# Patient Record
Sex: Female | Born: 1995 | Race: Black or African American | Hispanic: No | Marital: Single | Smoking: Current every day smoker
Health system: Southern US, Community
[De-identification: ages and names within clinical notes are randomized; demographics above are authoritative.]

## PROBLEM LIST (undated history)

## (undated) DIAGNOSIS — R569 Unspecified convulsions: Secondary | ICD-10-CM

## (undated) DIAGNOSIS — N390 Urinary tract infection, site not specified: Secondary | ICD-10-CM

---
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
## 2015-02-23 ENCOUNTER — Emergency Department (HOSPITAL_COMMUNITY)
Admission: EM | Admit: 2015-02-23 | Discharge: 2015-02-23 | Disposition: A | Discharge status: Home / Self Care | Payer: Medicaid Other | Attending: Emergency Medicine | Admitting: Emergency Medicine

## 2015-02-23 ENCOUNTER — Encounter (HOSPITAL_COMMUNITY): Payer: Self-pay

## 2015-02-23 DIAGNOSIS — S39012A Strain of muscle, fascia and tendon of lower back, initial encounter: Secondary | ICD-10-CM | POA: Insufficient documentation

## 2015-02-23 DIAGNOSIS — Y9289 Other specified places as the place of occurrence of the external cause: Secondary | ICD-10-CM | POA: Insufficient documentation

## 2015-02-23 DIAGNOSIS — F17210 Nicotine dependence, cigarettes, uncomplicated: Secondary | ICD-10-CM | POA: Insufficient documentation

## 2015-02-23 DIAGNOSIS — Y9389 Activity, other specified: Secondary | ICD-10-CM | POA: Insufficient documentation

## 2015-02-23 DIAGNOSIS — X58XXXA Exposure to other specified factors, initial encounter: Secondary | ICD-10-CM | POA: Insufficient documentation

## 2015-02-23 DIAGNOSIS — Z87440 Personal history of urinary (tract) infections: Secondary | ICD-10-CM | POA: Insufficient documentation

## 2015-02-23 DIAGNOSIS — Z3202 Encounter for pregnancy test, result negative: Secondary | ICD-10-CM | POA: Insufficient documentation

## 2015-02-23 DIAGNOSIS — Y998 Other external cause status: Secondary | ICD-10-CM | POA: Insufficient documentation

## 2015-02-23 LAB — I-STAT BETA HCG BLOOD, ED (MC, WL, AP ONLY)

## 2015-02-23 MED ORDER — METHOCARBAMOL 500 MG PO TABS: 500.0000 mg | ORAL_TABLET | Freq: Two times a day (BID) | ORAL | Status: DC

## 2015-02-23 MED ORDER — NAPROXEN 500 MG PO TABS: 500.0000 mg | ORAL_TABLET | Freq: Two times a day (BID) | ORAL | Status: DC
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
## 2015-02-23 NOTE — Discharge Instructions (Signed)
Recommend insoles in your shoes for support. Take breaks every few hours when standing for long periods of time. Alternate ice and heat to your back for formation and spasms. Take the medications prescribed to you as needed. Follow-up with your primary care doctor for a recheck of symptoms.  Back Injury Prevention Back injuries can be very painful. They can also be difficult to heal. After having one back injury, you are more likely to injure your back again. It is important to learn how to avoid injuring or re-injuring your back. The following tips can help you to prevent a back injury. WHAT SHOULD I KNOW ABOUT PHYSICAL FITNESS?  Exercise for 30 minutes per day on most days of the week or as told by your doctor. Make sure to:  Do aerobic exercises, such as walking, jogging, biking, or swimming.  Do exercises that increase balance and strength, such as tai chi and yoga.  Do stretching exercises. This helps with flexibility.  Try to develop strong belly (abdominal) muscles. Your belly muscles help to support your back.  Stay at a healthy weight. This helps to decrease your risk of a back injury. WHAT SHOULD I KNOW ABOUT MY DIET?  Talk with your doctor about your overall diet. Take supplements and vitamins only as told by your doctor.  Talk with your doctor about how much calcium and vitamin D you need each day. These nutrients help to prevent weakening of the bones (osteoporosis).  Include good sources of calcium in your diet, such as:  Dairy products.  Green leafy vegetables.  Products that have had calcium added to them (fortified).  Include good sources of vitamin D in your diet, such as:  Milk.  Foods that have had vitamin D added to them. WHAT SHOULD I KNOW ABOUT MY POSTURE?  Sit up straight and stand up straight. Avoid leaning forward when you sit or hunching over when you stand.  Choose chairs that have good low-back (lumbar) support.  If you work at a desk, sit close  to it so you do not need to lean over. Keep your chin tucked in. Keep your neck drawn back. Keep your elbows bent so your arms look like the letter "L" (right angle).  Sit high and close to the steering wheel when you drive. Add a low-back support to your car seat, if needed.  Avoid sitting or standing in one position for very long. Take breaks to get up, stretch, and walk around at least one time every hour. Take breaks every hour if you are driving for long periods of time.  Sleep on your side with your knees slightly bent, or sleep on your back with a pillow under your knees. Do not lie on the front of your body to sleep. WHAT SHOULD I KNOW ABOUT LIFTING, TWISTING, AND REACHING Lifting and Heavy Lifting  Avoid heavy lifting, especially lifting over and over again. If you must do heavy lifting:  Stretch before lifting.  Work slowly.  Rest between lifts.  Use a tool such as a cart or a dolly to move objects if one is available.  Make several small trips instead of carrying one heavy load.  Ask for help when you need it, especially when moving big objects.  Follow these steps when lifting:  Stand with your feet shoulder-width apart.  Get as close to the object as you can. Do not pick up a heavy object that is far from your body.  Use handles or lifting straps if  they are available.  Bend at your knees. Squat down, but keep your heels off the floor.  Keep your shoulders back. Keep your chin tucked in. Keep your back straight.  Lift the object slowly while you tighten the muscles in your legs, belly, and butt. Keep the object as close to the center of your body as possible.  Follow these steps when putting down a heavy load:  Stand with your feet shoulder-width apart.  Lower the object slowly while you tighten the muscles in your legs, belly, and butt. Keep the object as close to the center of your body as possible.  Keep your shoulders back. Keep your chin tucked in. Keep  your back straight.  Bend at your knees. Squat down, but keep your heels off the floor.  Use handles or lifting straps if they are available. Twisting and Reaching  Avoid lifting heavy objects above your waist.  Do not twist at your waist while you are lifting or carrying a load. If you need to turn, move your feet.  Do not bend over without bending at your knees.  Avoid reaching over your head, across a table, or for an object on a high surface.  WHAT ARE SOME OTHER TIPS?  Avoid wet floors and icy ground. Keep sidewalks clear of ice to prevent falls.   Do not sleep on a mattress that is too soft or too hard.   Keep items that you use often within easy reach.   Put heavier objects on shelves at waist level, and put lighter objects on lower or higher shelves.  Find ways to lower your stress, such as:  Exercise.  Massage.  Relaxation techniques.  Talk with your doctor if you feel anxious or depressed. These conditions can make back pain worse.  Wear flat heel shoes with cushioned soles.  Avoid making quick (sudden) movements.  Use both shoulder straps when carrying a backpack.  Do not use any tobacco products, including cigarettes, chewing tobacco, or electronic cigarettes. If you need help quitting, ask your doctor.   This information is not intended to replace advice given to you by your health care provider. Make sure you discuss any questions you have with your health care provider.   Document Released: 08/16/2007 Document Revised: 07/14/2014 Document Reviewed: 03/03/2014 Elsevier Interactive Patient Education 2016 Else[employer] Inc[employer] Lumbosacral Strain Lumbosacral strain is a strain of any of the parts that make up your lumbosacral vertebrae. Your lumbosacral vertebrae are the bones that make up the lower third of your backbone. Your lumbosacral vertebrae are held together by muscles and tough, fibrous tissue (ligaments).  CAUSES  A sudden blow to your back can  cause lumbosacral strain. Also, anything that causes an excessive stretch of the muscles in the low back can cause this strain. This is typically seen when people exert themselves strenuously, fall, lift heavy objects, bend, or crouch repeatedly. RISK FACTORS  Physically demanding work.  Participation in pushing or pulling sports or sports that require a sudden twist of the back (tennis, golf, baseball).  Weight lifting.  Excessive lower back curvature.  Forward-tilted pelvis.  Weak back or abdominal muscles or both.  Tight hamstrings. SIGNS AND SYMPTOMS  Lumbosacral strain may cause pain in the area of your injury or pain that moves (radiates) down your leg.  DIAGNOSIS Your health care provider can often diagnose lumbosacral strain through a physical exam. In some cases, you may need tests such as X-ray exams.  TREATMENT  Treatment for your lower back  injury depends on many factors that your clinician will have to evaluate. However, most treatment will include the use of anti-inflammatory medicines. HOME CARE INSTRUCTIONS   Avoid hard physical activities (tennis, racquetball, waterskiing) if you are not in proper physical condition for it. This may aggravate or create problems.  If you have a back problem, avoid sports requiring sudden body movements. Swimming and walking are generally safer activities.  Maintain good posture.  Maintain a healthy weight.  For acute conditions, you may put ice on the injured area.  Put ice in a plastic bag.  Place a towel between your skin and the bag.  Leave the ice on for 20 minutes, 2-3 times a day.  When the low back starts healing, stretching and strengthening exercises may be recommended. SEEK MEDICAL CARE IF:  Your back pain is getting worse.  You experience severe back pain not relieved with medicines. SEEK IMMEDIATE MEDICAL CARE IF:   You have numbness, tingling, weakness, or problems with the use of your arms or  legs.  There is a change in bowel or bladder control.  You have increasing pain in any area of the body, including your belly (abdomen).  You notice shortness of breath, dizziness, or feel faint.  You feel sick to your stomach (nauseous), are throwing up (vomiting), or become sweaty.  You notice discoloration of your toes or legs, or your feet get very cold. MAKE SURE YOU:   Understand these instructions.  Will watch your condition.  Will get help right away if you are not doing well or get worse.   This information is not intended to replace advice given to you by your health care provider. Make sure you discuss any questions you have with your health care provider.   Document Released: 12/07/2004 Document Revised: 03/20/2014 Document Reviewed: 10/16/2012 Elsevier Interactive Patient Education [employer].

## 2015-02-23 NOTE — ED Notes (Signed)
Nurse drawing labs. 

## 2015-02-23 NOTE — ED Notes (Signed)
Pt presents with c/o lower back pain. Pt reports she has had this for approx 1.5 months after she started working at a [employer]. No injury.

## 2015-10-28 ENCOUNTER — Emergency Department (HOSPITAL_COMMUNITY)
Admission: EM | Admit: 2015-10-28 | Discharge: 2015-10-29 | Disposition: A | Discharge status: Home / Self Care | Payer: No Typology Code available for payment source | Attending: Emergency Medicine | Admitting: Emergency Medicine

## 2015-10-28 ENCOUNTER — Encounter (HOSPITAL_COMMUNITY): Payer: Self-pay | Admitting: Emergency Medicine

## 2015-10-28 DIAGNOSIS — Y92410 Unspecified street and highway as the place of occurrence of the external cause: Secondary | ICD-10-CM | POA: Diagnosis not present

## 2015-10-28 DIAGNOSIS — M79601 Pain in right arm: Secondary | ICD-10-CM | POA: Diagnosis not present

## 2015-10-28 DIAGNOSIS — Y999 Unspecified external cause status: Secondary | ICD-10-CM | POA: Insufficient documentation

## 2015-10-28 DIAGNOSIS — S299XXA Unspecified injury of thorax, initial encounter: Secondary | ICD-10-CM | POA: Diagnosis present

## 2015-10-28 DIAGNOSIS — Y939 Activity, unspecified: Secondary | ICD-10-CM | POA: Insufficient documentation

## 2015-10-28 DIAGNOSIS — S20211A Contusion of right front wall of thorax, initial encounter: Secondary | ICD-10-CM

## 2015-10-28 DIAGNOSIS — F17210 Nicotine dependence, cigarettes, uncomplicated: Secondary | ICD-10-CM | POA: Insufficient documentation
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
## 2015-10-29 ENCOUNTER — Encounter (HOSPITAL_COMMUNITY): Payer: Self-pay | Admitting: Radiology

## 2015-10-29 ENCOUNTER — Emergency Department (HOSPITAL_COMMUNITY): Payer: No Typology Code available for payment source

## 2015-10-29 DIAGNOSIS — S20211A Contusion of right front wall of thorax, initial encounter: Secondary | ICD-10-CM | POA: Diagnosis not present

## 2015-10-29 LAB — I-STAT CREATININE, ED: Creatinine, Ser: 0.6 mg/dL (ref 0.44–1.00)

## 2015-10-29 LAB — I-STAT BETA HCG BLOOD, ED (MC, WL, AP ONLY): I-stat hCG, quantitative: <5 m[IU]/mL (ref <5)

## 2015-10-29 MED ORDER — NAPROXEN 500 MG PO TABS
500.0000 mg | ORAL_TABLET | Freq: Two times a day (BID) | ORAL | 0 refills | Status: AC
Start: 2015-10-29 — End: ?

## 2015-10-29 MED ORDER — IOPAMIDOL (ISOVUE-300) INJECTION 61%
INTRAVENOUS | Status: AC
  Administered 2015-10-29: 75 mL
  Filled 2015-10-29: qty 75
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
## 2015-10-29 NOTE — ED Notes (Signed)
Pt stable, ambulatory, states understanding of discharge instructions 

## 2016-08-22 ENCOUNTER — Encounter (HOSPITAL_COMMUNITY): Payer: Self-pay | Admitting: *Deleted

## 2016-08-22 ENCOUNTER — Inpatient Hospital Stay (HOSPITAL_COMMUNITY)
Admission: AD | Admit: 2016-08-22 | Discharge: 2016-08-22 | Disposition: A | Discharge status: Home / Self Care | Payer: Medicaid Other | Source: Ambulatory Visit | Attending: Obstetrics and Gynecology | Admitting: Obstetrics and Gynecology

## 2016-08-22 DIAGNOSIS — B9689 Other specified bacterial agents as the cause of diseases classified elsewhere: Secondary | ICD-10-CM | POA: Diagnosis not present

## 2016-08-22 DIAGNOSIS — R109 Unspecified abdominal pain: Secondary | ICD-10-CM | POA: Diagnosis present

## 2016-08-22 DIAGNOSIS — Z87440 Personal history of urinary (tract) infections: Secondary | ICD-10-CM | POA: Insufficient documentation

## 2016-08-22 DIAGNOSIS — N760 Acute vaginitis: Secondary | ICD-10-CM | POA: Diagnosis not present

## 2016-08-22 DIAGNOSIS — F17290 Nicotine dependence, other tobacco product, uncomplicated: Secondary | ICD-10-CM | POA: Insufficient documentation

## 2016-08-22 DIAGNOSIS — Z8669 Personal history of other diseases of the nervous system and sense organs: Secondary | ICD-10-CM | POA: Diagnosis not present

## 2016-08-22 DIAGNOSIS — Z3202 Encounter for pregnancy test, result negative: Secondary | ICD-10-CM | POA: Insufficient documentation

## 2016-08-22 DIAGNOSIS — N898 Other specified noninflammatory disorders of vagina: Secondary | ICD-10-CM | POA: Diagnosis present

## 2016-08-22 LAB — WET PREP, GENITAL
SPERM: NONE SEEN
TRICH WET PREP: NONE SEEN
YEAST WET PREP: NONE SEEN

## 2016-08-22 LAB — URINALYSIS, ROUTINE W REFLEX MICROSCOPIC
Bilirubin Urine: NEGATIVE
Glucose, UA: NEGATIVE mg/dL
Hgb urine dipstick: NEGATIVE
KETONES UR: NEGATIVE mg/dL
LEUKOCYTES UA: NEGATIVE
NITRITE: NEGATIVE
PH: 6 (ref 5.0–8.0)
Protein, ur: NEGATIVE mg/dL
Specific Gravity, Urine: 1.021 (ref 1.005–1.030)

## 2016-08-22 LAB — POCT PREGNANCY, URINE: Preg Test, Ur: NEGATIVE

## 2016-08-22 MED ORDER — METRONIDAZOLE 500 MG PO TABS: 500.0000 mg | ORAL_TABLET | Freq: Two times a day (BID) | ORAL | 0 refills | Status: DC

## 2016-08-22 NOTE — MAU Provider Note (Signed)
History     CSN: [id number]  Arrival date and time: 08/22/16 1191   First Provider Initiated Contact with Patient 08/22/16 2037      Chief Complaint  Patient presents with  . Vaginal Discharge  . Abdominal Pain   HPI Alicia Morales is a 21 y.o. G0P0000 non pregnant female who presents complaining of occasional sharp shooting lower abdominal pain and vaginal discharge. She states the pain started last night after intercourse. She states she has no pain at this time. She also has a thick white discharge with a fishy odor. She has one partner and is trying to get pregnant. She has irregular periods, LMP April. She denies vaginal bleeding.   OB History    Gravida Para Term Preterm AB Living   0 0 0 0 0 0   SAB TAB Ectopic Multiple Live Births   0 0 0 0        Past Medical History:  Diagnosis Date  . Seizures (HCC)    childhood  . Urinary tract infection     History reviewed. No pertinent surgical history.  History reviewed. No pertinent family history.  Social History  Substance Use Topics  . Smoking status: Current Every Day Smoker    Packs/day: 0.20    Types: Cigars  . Smokeless tobacco: Never Used  . Alcohol use No    Allergies: No Known Allergies  Prescriptions Prior to Admission  Medication Sig Dispense Refill Last Dose  . naproxen (NAPROSYN) 500 MG tablet Take 1 tablet (500 mg total) by mouth 2 (two) times daily. 14 tablet 0     Review of Systems  Constitutional: Negative.  Negative for activity change, appetite change, fatigue and fever.  Respiratory: Negative.  Negative for shortness of breath.   Cardiovascular: Negative.  Negative for chest pain.  Gastrointestinal: Positive for abdominal pain. Negative for constipation, diarrhea, nausea and vomiting.  Genitourinary: Positive for vaginal discharge. Negative for dyspareunia and vaginal bleeding.  Neurological: Negative.  Negative for dizziness and headaches.   Physical Exam   Blood pressure 132/67, pulse  80, temperature 98.7 F (37.1 C), temperature source Oral, resp. rate 18, weight 227 lb 0.6 oz (103 kg), SpO2 99 %.  Physical Exam  Nursing note and vitals reviewed. Constitutional: She is oriented to person, place, and time. She appears well-developed and well-nourished.  HENT:  Head: Normocephalic and atraumatic.  Eyes: Conjunctivae are normal. No scleral icterus.  Cardiovascular: Normal rate, regular rhythm and normal heart sounds.   Respiratory: Effort normal and breath sounds normal. No respiratory distress.  GI: Soft. She exhibits no distension. There is no guarding.  Genitourinary: Uterus normal. Cervix exhibits no motion tenderness and no friability. No tenderness or bleeding in the vagina. Vaginal discharge found.  Neurological: She is alert and oriented to person, place, and time.  Skin: Skin is warm and dry.  Psychiatric: She has a normal mood and affect. Her behavior is normal. Judgment and thought content normal.   Pelvic exam: Cervix pink, visually closed, without lesion, scant white creamy discharge, vaginal walls and external genitalia normal Bimanual exam: Cervix 0/long/high, firm, anterior, neg CMT, uterus nontender, nonenlarged, adnexa without tenderness, enlargement, or mass  MAU Course  Procedures Results for orders placed or performed during the hospital encounter of 08/22/16 (from the past 24 hour(s))  Urinalysis, Routine w reflex microscopic     Status: None   Collection Time: 08/22/16  6:43 PM  Result Value Ref Range   Color, Urine YELLOW YELLOW  APPearance CLEAR CLEAR   Specific Gravity, Urine 1.021 1.005 - 1.030   pH 6.0 5.0 - 8.0   Glucose, UA NEGATIVE NEGATIVE mg/dL   Hgb urine dipstick NEGATIVE NEGATIVE   Bilirubin Urine NEGATIVE NEGATIVE   Ketones, ur NEGATIVE NEGATIVE mg/dL   Protein, ur NEGATIVE NEGATIVE mg/dL   Nitrite NEGATIVE NEGATIVE   Leukocytes, UA NEGATIVE NEGATIVE  Pregnancy, urine POC     Status: None   Collection Time: 08/22/16  6:55  PM  Result Value Ref Range   Preg Test, Ur NEGATIVE NEGATIVE    MDM UA, UPT, wet prep and gc/chlamydia  Wet prep revealed BV  Assessment and Plan  #1: BV: treat with flagyl 500mg  BID x 7 days ok to D/C home.  Cleone SlimCaroline Neill SNM 08/22/2016, 8:45 PM

## 2016-08-22 NOTE — Discharge Instructions (Signed)

## 2016-08-22 NOTE — MAU Note (Signed)
+  lower abdominal pain Sharp Started last night Intermittent Rating pain 5/10 Has not taken anything for the pain  +vaginal discharge White Thick Odor  LMP beginning of April; patient unsure

## 2016-08-23 LAB — GC/CHLAMYDIA PROBE AMP (~~LOC~~) NOT AT ARMC
Chlamydia: NEGATIVE
Neisseria Gonorrhea: NEGATIVE

## 2016-12-13 ENCOUNTER — Inpatient Hospital Stay (HOSPITAL_COMMUNITY)
Admission: AD | Admit: 2016-12-13 | Discharge: 2016-12-14 | Disposition: A | Discharge status: Home / Self Care | Payer: Medicaid Other | Source: Ambulatory Visit | Attending: Obstetrics & Gynecology | Admitting: Obstetrics & Gynecology

## 2016-12-13 DIAGNOSIS — A5901 Trichomonal vulvovaginitis: Secondary | ICD-10-CM | POA: Diagnosis not present

## 2016-12-13 DIAGNOSIS — N949 Unspecified condition associated with female genital organs and menstrual cycle: Secondary | ICD-10-CM

## 2016-12-13 DIAGNOSIS — B373 Candidiasis of vulva and vagina: Secondary | ICD-10-CM | POA: Diagnosis not present

## 2016-12-13 DIAGNOSIS — A599 Trichomoniasis, unspecified: Secondary | ICD-10-CM

## 2016-12-13 DIAGNOSIS — R109 Unspecified abdominal pain: Secondary | ICD-10-CM

## 2016-12-13 DIAGNOSIS — F17290 Nicotine dependence, other tobacco product, uncomplicated: Secondary | ICD-10-CM | POA: Insufficient documentation

## 2016-12-13 DIAGNOSIS — N730 Acute parametritis and pelvic cellulitis: Secondary | ICD-10-CM | POA: Diagnosis not present

## 2016-12-13 DIAGNOSIS — N941 Unspecified dyspareunia: Secondary | ICD-10-CM

## 2016-12-13 DIAGNOSIS — B3731 Acute candidiasis of vulva and vagina: Secondary | ICD-10-CM

## 2016-12-13 LAB — URINALYSIS, ROUTINE W REFLEX MICROSCOPIC
BACTERIA UA: NONE SEEN
Bilirubin Urine: NEGATIVE
Glucose, UA: NEGATIVE mg/dL
Hgb urine dipstick: NEGATIVE
KETONES UR: NEGATIVE mg/dL
Nitrite: NEGATIVE
PH: 6 (ref 5.0–8.0)
Protein, ur: NEGATIVE mg/dL
SPECIFIC GRAVITY, URINE: 1.024 (ref 1.005–1.030)

## 2016-12-13 LAB — POCT PREGNANCY, URINE: PREG TEST UR: NEGATIVE

## 2016-12-13 NOTE — MAU Note (Signed)
Pt c/o lower abdominal that started the night before last after she had intercourse. States she has a "fishy odor" that she noticed yesterday. Pt denies vaginal discharge, but notices some "yellow stuff" when she wipes. Pt denies vaginal bleeding. LMP: about 2 weeks ago.

## 2016-12-14 ENCOUNTER — Encounter (HOSPITAL_COMMUNITY): Payer: Self-pay | Admitting: *Deleted

## 2016-12-14 ENCOUNTER — Inpatient Hospital Stay (HOSPITAL_COMMUNITY): Payer: Medicaid Other

## 2016-12-14 DIAGNOSIS — N730 Acute parametritis and pelvic cellulitis: Secondary | ICD-10-CM

## 2016-12-14 LAB — WET PREP, GENITAL: SPERM: NONE SEEN

## 2016-12-14 LAB — CBC
HCT: 37.3 % (ref 36.0–46.0)
HEMOGLOBIN: 13 g/dL (ref 12.0–15.0)
MCH: 30.9 pg (ref 26.0–34.0)
MCHC: 34.9 g/dL (ref 30.0–36.0)
MCV: 88.6 fL (ref 78.0–100.0)
PLATELETS: 288 10*3/uL (ref 150–400)
RBC: 4.21 MIL/uL (ref 3.87–5.11)
RDW: 12.3 % (ref 11.5–15.5)
WBC: 14.7 10*3/uL — ABNORMAL HIGH (ref 4.0–10.5)

## 2016-12-14 MED ORDER — METRONIDAZOLE 500 MG PO TABS
2000.0000 mg | ORAL_TABLET | Freq: Once | ORAL | Status: AC
  Administered 2016-12-14: 2000 mg via ORAL
  Filled 2016-12-14: qty 4

## 2016-12-14 MED ORDER — CEFTRIAXONE SODIUM 250 MG IJ SOLR
250.0000 mg | Freq: Once | INTRAMUSCULAR | Status: AC
  Administered 2016-12-14: 250 mg via INTRAMUSCULAR
  Filled 2016-12-14: qty 250

## 2016-12-14 MED ORDER — DOXYCYCLINE HYCLATE 100 MG PO CAPS: 100.0000 mg | ORAL_CAPSULE | Freq: Two times a day (BID) | ORAL | 0 refills | Status: AC

## 2016-12-14 MED ORDER — FLUCONAZOLE 150 MG PO TABS: 150.0000 mg | ORAL_TABLET | Freq: Every day | ORAL | 1 refills | Status: DC

## 2016-12-14 NOTE — Discharge Instructions (Signed)
In late 2019, the [facility] will be moving to the [facility] campus. At that time, the MAU will no longer serve non-pregnant patients. We encourage you to establish care with a provider before that time, so that you can be seen with any GYN concerns, like vaginal discharge, urinary tract infection, etc.. in a timely manner. In order to make the office visit more convenient, the Center for [facility] at [facility] will be offering evening hours from 4pm-8pm on Mondays starting 11/20/16. There will be same-day appointments, walk-in appointments and scheduled appointments available during this time.    Center for [facility] @ [facility] [phone]  For urgent needs, [facility] is also available for management of urgent GYN complaints such as vaginal discharge.   [employer] extends eligibility for family planning services to reduce unintended pregnancies and improve the well-being of children and families.   Eligible individuals whose income is at or below 195% of the federal poverty level and who are:  - [place] citizens, documented immigrants or qualified aliens;  - Residents of [place];  - Not incarcerated; and  - Not pregnant.   [employer] Family Planning Contact Information:  Medical Assistance Clinical Section Phone: ([phone])[phone] Email: [email or web address]     Pelvic Inflammatory Disease Pelvic inflammatory disease (PID) refers to an infection in some or all of the female organs. The infection can be in the uterus, ovaries, fallopian tubes, or the surrounding tissues in the pelvis. PID can cause abdominal or pelvic pain that comes on suddenly (acute pelvic pain). PID is a serious infection because it can lead to lasting (chronic) pelvic pain or the inability to have children (infertility). What are the causes? This condition is most often caused by an infection that is spread during sexual contact. However, the  infection can also be caused by the normal bacteria that are found in the vaginal tissues if these bacteria travel upward into the reproductive organs. PID can also occur following:  The birth of a baby.  A miscarriage.  An abortion.  Major pelvic surgery.  The use of an intrauterine device (IUD).  A sexual assault.  What increases the risk? This condition is more likely to develop in women who:  Are younger than 21 years of age.  Are sexually active at [facility] age.  Use nonbarrier contraception.  Have multiple sexual partners.  Have sex with someone who has symptoms of an STD (sexually transmitted disease).  Use oral contraception.  At times, certain behaviors can also increase the possibility of getting PID, such as:  Using a vaginal douche.  Having an IUD in place.  What are the signs or symptoms? Symptoms of this condition include:  Abdominal or pelvic pain.  Fever.  Chills.  Abnormal vaginal discharge.  Abnormal uterine bleeding.  Unusual pain shortly after the end of a menstrual period.  Painful urination.  Pain with sexual intercourse.  Nausea and vomiting.  How is this diagnosed? To diagnose this condition, your health care provider will do a physical exam and take your medical history. A pelvic exam typically reveals great tenderness in the uterus and the surrounding pelvic tissues. You may also have tests, such as:  Lab tests, including a pregnancy test, blood tests, and urine test.  Culture tests of the vagina and cervix to check for an STD.  Ultrasound.  A laparoscopic procedure to look inside the pelvis.  Examining vaginal secretions under a microscope.  How is this  treated? Treatment for this condition may involve one or more approaches.  Antibiotic medicines may be prescribed to be taken by mouth.  Sexual partners may need to be treated if the infection is caused by an STD.  For more severe cases, hospitalization may be needed  to give antibiotics directly into a vein through an IV tube.  Surgery may be needed if other treatments do not help, but this is rare.  It may take weeks until you are completely well. If you are diagnosed with PID, you should also be checked for human immunodeficiency virus (HIV). Your health care provider may test you for infection again 3 months after treatment. You should not have unprotected sex. Follow these instructions at home:  Take over-the-counter and prescription medicines only as told by your health care provider.  If you were prescribed an antibiotic medicine, take it as told by your health care provider. Do not stop taking the antibiotic even if you start to feel better.  Do not have sexual intercourse until treatment is completed or as told by your health care provider. If PID is confirmed, your recent sexual partners will need treatment, especially if you had unprotected sex.  Keep all follow-up visits as told by your health care provider. This is important. Contact a health care provider if:  You have increased or abnormal vaginal discharge.  Your pain does not improve.  You vomit.  You have a fever.  You cannot tolerate your medicines.  Your partner has an STD.  You have pain when you urinate. Get help right away if:  You have increased abdominal or pelvic pain.  You have chills.  Your symptoms are not better in 72 hours even with treatment. This information is not intended to replace advice given to you by your health care provider. Make sure you discuss any questions you have with your health care provider. Document Released: 02/27/2005 Document Revised: 08/05/2015 Document Reviewed: 04/06/2014 Elsevier Interactive Patient Education  2018 [employer].    Trichomoniasis Trichomoniasis is an STI (sexually transmitted infection) that can affect both women and men. In women, the outer area of the female genitalia (vulva) and the vagina are affected. In  men, the penis is mainly affected, but the prostate and other reproductive organs can also be involved. This condition can be treated with medicine. It often has no symptoms (is asymptomatic), especially in men. What are the causes? This condition is caused by an organism called Trichomonas vaginalis. Trichomoniasis most often spreads from person to person (is contagious) through sexual contact. What increases the risk? The following factors may make you more likely to develop this condition:  Having unprotected sexual intercourse.  Having sexual intercourse with a partner who has trichomoniasis.  Having multiple sexual partners.  Having had previous trichomoniasis infections or other STIs.  What are the signs or symptoms? In women, symptoms of trichomoniasis include:  Abnormal vaginal discharge that is clear, white, gray, or yellow-green and foamy and has an unusual "fishy" odor.  Itching and irritation of the vagina and vulva.  Burning or pain during urination or sexual intercourse.  Genital redness and swelling.  In men, symptoms of trichomoniasis include:  Penile discharge that may be foamy or contain pus.  Pain in the penis. This may happen only when urinating.  Itching or irritation inside the penis.  Burning after urination or ejaculation.  How is this diagnosed? In women, this condition may be found during a routine Pap test or physical exam. It may  be found in men during a routine physical exam. Your health care provider may perform tests to help diagnose this infection, such as:  Urine tests (men and women).  The following in women: ? Testing the pH of the vagina. ? A vaginal swab test that checks for the Trichomonas vaginalis organism. ? Testing vaginal secretions.  Your health care provider may test you for other STIs, including HIV (human immunodeficiency virus). How is this treated? This condition is treated with medicine taken by mouth (orally), such as  metronidazole or tinidazole to fight the infection. Your sexual partner(s) may also need to be tested and treated.  If you are a woman and you plan to become pregnant or think you may be pregnant, tell your health care provider right away. Some medicines that are used to treat the infection should not be taken during pregnancy.  Your health care provider may recommend over-the-counter medicines or creams to help relieve itching or irritation. You may be tested for infection again 3 months after treatment. Follow these instructions at home:  Take and use over-the-counter and prescription medicines, including creams, only as told by your health care provider.  Do not have sexual intercourse until one week after you finish your medicine, or until your health care provider approves. Ask your health care provider when you may resume sexual intercourse.  (Women) Do not douche or wear tampons while you have the infection.  Discuss your infection with your sexual partner(s). Make sure that your partner gets tested and treated, if necessary.  Keep all follow-up visits as told by your health care provider. This is important. How is this prevented?  Use condoms every time you have sex. Using condoms correctly and consistently can help protect against STIs.  Avoid having multiple sexual partners.  Talk with your sexual partner about any symptoms that either of you may have, as well as any history of STIs.  Get tested for STIs and STDs (sexually transmitted diseases) before you have sex. Ask your partner to do the same.  Do not have sexual contact if you have symptoms of trichomoniasis or another STI. Contact a health care provider if:  You still have symptoms after you finish your medicine.  You develop pain in your abdomen.  You have pain when you urinate.  You have bleeding after sexual intercourse.  You develop a rash.  You feel nauseous or you vomit.  You plan to become pregnant or  think you may be pregnant. Summary  Trichomoniasis is an STI (sexually transmitted infection) that can affect both women and men.  This condition often has no symptoms (is asymptomatic), especially in men.  You should not have sexual intercourse until one week after you finish your medicine, or until your health care provider approves. Ask your health care provider when you may resume sexual intercourse.  Discuss your infection with your sexual partner. Make sure that your partner gets tested and treated, if necessary. This information is not intended to replace advice given to you by your health care provider. Make sure you discuss any questions you have with your health care provider. Document Released: 08/23/2000 Document Revised: 01/21/2016 Document Reviewed: 01/21/2016 Elsevier Interactive Patient Education  2017 [employer].

## 2016-12-14 NOTE — MAU Provider Note (Signed)
History     CSN: [id number]  Arrival date and time: 12/13/16 2228  First Provider Initiated Contact with Patient 12/14/16 0011      Chief Complaint  Patient presents with  . Abdominal Pain  . Vaginal Discharge   HPI Alicia Morales is a 21 y.o. female who presents with abdominal cramping & vaginal discharge. Symptoms began after intercourse 2 days ago. Discharge is yellow mucoid & has a fishy odor. Reports vaginal irritation. Denies vaginal bleeding, fever/chills, or dysuria. In monogamous relationship x 5 years & no history of STIs. Rates lower abdominal pain 7/10 & describes as intermittent sharp. Attempted to have IC again & had pain with intercourse. No postcoital bleeding.    Past Medical History:  Diagnosis Date  . Seizures (HCC)    childhood  . Urinary tract infection     History reviewed. No pertinent surgical history.  History reviewed. No pertinent family history.  Social History  Substance Use Topics  . Smoking status: Current Every Day Smoker    Packs/day: 0.20    Types: Cigars  . Smokeless tobacco: Never Used  . Alcohol use No    Allergies: No Known Allergies  Prescriptions Prior to Admission  Medication Sig Dispense Refill Last Dose  . metroNIDAZOLE (FLAGYL) 500 MG tablet Take 1 tablet (500 mg total) by mouth 2 (two) times daily. 14 tablet 0   . naproxen (NAPROSYN) 500 MG tablet Take 1 tablet (500 mg total) by mouth 2 (two) times daily. 14 tablet 0     Review of Systems  Constitutional: Negative.   Gastrointestinal: Positive for abdominal pain. Negative for constipation, diarrhea and vomiting.  Genitourinary: Positive for dyspareunia and vaginal discharge. Negative for dysuria and vaginal bleeding.   Physical Exam   Blood pressure 126/65, pulse 79, temperature 98.5 F (36.9 C), temperature source Oral, resp. rate 16, height  (1.702 m), weight 205 lb (93 kg), SpO2 100 %.  Physical Exam  Nursing note and vitals reviewed. Constitutional: She is  oriented to person, place, and time. She appears well-developed and well-nourished. No distress.  HENT:  Head: Normocephalic and atraumatic.  Eyes: Conjunctivae are normal. Right eye exhibits no discharge. Left eye exhibits no discharge. No scleral icterus.  Neck: Normal range of motion.  Respiratory: Effort normal. No respiratory distress.  GI: Soft. There is tenderness in the right lower quadrant, suprapubic area and left lower quadrant. There is no rigidity and no guarding.  Genitourinary: Uterus normal. Cervix exhibits motion tenderness and discharge. Cervix exhibits no friability. Right adnexum displays tenderness. Right adnexum displays no mass. Left adnexum displays tenderness. Left adnexum displays no mass. No bleeding in the vagina. Vaginal discharge found.  Neurological: She is alert and oriented to person, place, and time.  Skin: Skin is warm and dry. She is not diaphoretic.  Psychiatric: She has a normal mood and affect. Her behavior is normal. Judgment and thought content normal.    MAU Course  Procedures Results for orders placed or performed during the hospital encounter of 12/13/16 (from the past 24 hour(s))  Urinalysis, Routine w reflex microscopic     Status: Abnormal   Collection Time: 12/13/16 11:12 PM  Result Value Ref Range   Color, Urine YELLOW YELLOW   APPearance HAZY (A) CLEAR   Specific Gravity, Urine 1.024 1.005 - 1.030   pH 6.0 5.0 - 8.0   Glucose, UA NEGATIVE NEGATIVE mg/dL   Hgb urine dipstick NEGATIVE NEGATIVE   Bilirubin Urine NEGATIVE NEGATIVE   Ketones, ur NEGATIVE  NEGATIVE mg/dL   Protein, ur NEGATIVE NEGATIVE mg/dL   Nitrite NEGATIVE NEGATIVE   Leukocytes, UA TRACE (A) NEGATIVE   RBC / HPF 0-5 0 - 5 RBC/hpf   WBC, UA 0-5 0 - 5 WBC/hpf   Bacteria, UA NONE SEEN NONE SEEN   Squamous Epithelial / LPF 0-5 (A) NONE SEEN   Mucus PRESENT   Pregnancy, urine POC     Status: None   Collection Time: 12/13/16 11:38 PM  Result Value Ref Range   Preg Test,  Ur NEGATIVE NEGATIVE  Wet prep, genital     Status: Abnormal   Collection Time: 12/14/16 12:30 AM  Result Value Ref Range   Yeast Wet Prep HPF POC PRESENT (A) NONE SEEN   Trich, Wet Prep PRESENT (A) NONE SEEN   Clue Cells Wet Prep HPF POC PRESENT (A) NONE SEEN   WBC, Wet Prep HPF POC FEW (A) NONE SEEN   Sperm NONE SEEN   CBC     Status: Abnormal   Collection Time: 12/14/16 12:46 AM  Result Value Ref Range   WBC 14.7 (H) 4.0 - 10.5 K/uL   RBC 4.21 3.87 - 5.11 MIL/uL   Hemoglobin 13.0 12.0 - 15.0 g/dL   HCT 98.1 19.1 - 47.8 %   MCV 88.6 78.0 - 100.0 fL   MCH 30.9 26.0 - 34.0 pg   MCHC 34.9 30.0 - 36.0 g/dL   RDW 29.5 62.1 - 30.8 %   Platelets 288 150 - 400 K/uL   US Pelvis Transvanginal Non-ob (tv Only)  Result Date: 12/14/2016 CLINICAL DATA:  Low abdominal pain for 2 days.  LMP 2 weeks ago. EXAM: TRANSABDOMINAL AND TRANSVAGINAL ULTRASOUND OF PELVIS TECHNIQUE: Both transabdominal and transvaginal ultrasound examinations of the pelvis were performed. Transabdominal technique was performed for global imaging of the pelvis including uterus, ovaries, adnexal regions, and pelvic cul-de-sac. It was necessary to proceed with endovaginal exam following the transabdominal exam to visualize the ovaries and endometrium. COMPARISON:  None FINDINGS: Uterus Measurements: 8.6 x 3.1 x 4.2 cm. Uterus is anteverted. No fibroids or other mass visualized. Endometrium Thickness: 8.8 mm.  Small amount of fluid in the endocervical canal. Right ovary Measurements: 5.4 x 3.5 x 3.7 cm. Normal follicular changes. Dominant complex cyst, likely hemorrhagic and measuring 2.9 cm maximal diameter. No abnormal adnexal mass. Left ovary Measurements: 4.8 x 2.3 x 2.2 cm. Normal appearance/no adnexal mass. Other findings No abnormal free fluid. Flow is demonstrated in both ovaries on color flow Doppler imaging. IMPRESSION: Small amount of fluid in the endocervical canal is likely physiologic. Otherwise normal appearance of the  uterus and ovaries. Electronically Signed   By: Burman Nieves M.D.   On: 12/14/2016 01:26   US Pelvis (transabdominal Only)  Result Date: 12/14/2016 CLINICAL DATA:  Low abdominal pain for 2 days.  LMP 2 weeks ago. EXAM: TRANSABDOMINAL AND TRANSVAGINAL ULTRASOUND OF PELVIS TECHNIQUE: Both transabdominal and transvaginal ultrasound examinations of the pelvis were performed. Transabdominal technique was performed for global imaging of the pelvis including uterus, ovaries, adnexal regions, and pelvic cul-de-sac. It was necessary to proceed with endovaginal exam following the transabdominal exam to visualize the ovaries and endometrium. COMPARISON:  None FINDINGS: Uterus Measurements: 8.6 x 3.1 x 4.2 cm. Uterus is anteverted. No fibroids or other mass visualized. Endometrium Thickness: 8.8 mm.  Small amount of fluid in the endocervical canal. Right ovary Measurements: 5.4 x 3.5 x 3.7 cm. Normal follicular changes. Dominant complex cyst, likely hemorrhagic and measuring 2.9 cm maximal diameter.  No abnormal adnexal mass. Left ovary Measurements: 4.8 x 2.3 x 2.2 cm. Normal appearance/no adnexal mass. Other findings No abnormal free fluid. Flow is demonstrated in both ovaries on color flow Doppler imaging. IMPRESSION: Small amount of fluid in the endocervical canal is likely physiologic. Otherwise normal appearance of the uterus and ovaries. Electronically Signed   By: Burman Nieves M.D.   On: 12/14/2016 01:26     MDM UPT negative CBC, GC/CT, wet prep + CMT, wet prep + trich, yeast, clue & WBC. Will tx for PID. Ultrasound shows no evidence of TOA. CBC with leukocytosis, pt afebrile.  Rocephin 250 mg IM, flagyl 2gm PO Assessment and Plan  A: 1. PID (acute pelvic inflammatory disease)   2. Trichomoniasis   3. Vaginal yeast infection    P: Discharge home Rx doxy x 2 weeks & diflucan w/1 refill Discussed reasons to return to MAU vs ED No IC until 1 week after tx EPT rx & info sheet given GC/CT  pending   Judeth Horn 12/14/2016, 12:11 AM

## 2016-12-15 LAB — GC/CHLAMYDIA PROBE AMP (~~LOC~~) NOT AT ARMC
Chlamydia: NEGATIVE
NEISSERIA GONORRHEA: NEGATIVE

## 2017-04-09 ENCOUNTER — Inpatient Hospital Stay (HOSPITAL_COMMUNITY)
Admission: AD | Admit: 2017-04-09 | Discharge: 2017-04-10 | Disposition: A | Discharge status: Home / Self Care | Payer: Medicaid Other | Source: Ambulatory Visit | Attending: Obstetrics and Gynecology | Admitting: Obstetrics and Gynecology

## 2017-04-09 DIAGNOSIS — B9689 Other specified bacterial agents as the cause of diseases classified elsewhere: Secondary | ICD-10-CM | POA: Insufficient documentation

## 2017-04-09 DIAGNOSIS — B373 Candidiasis of vulva and vagina: Secondary | ICD-10-CM | POA: Insufficient documentation

## 2017-04-09 DIAGNOSIS — N760 Acute vaginitis: Secondary | ICD-10-CM | POA: Diagnosis present

## 2017-04-09 DIAGNOSIS — F17290 Nicotine dependence, other tobacco product, uncomplicated: Secondary | ICD-10-CM | POA: Insufficient documentation

## 2017-04-09 DIAGNOSIS — B3731 Acute candidiasis of vulva and vagina: Secondary | ICD-10-CM | POA: Diagnosis present

## 2017-04-09 NOTE — MAU Note (Signed)
Pt states she's had vaginal odor x 2 days. Some vaginal pain/ no discharge.  Intercourse on Saturday.

## 2017-04-10 ENCOUNTER — Encounter (HOSPITAL_COMMUNITY): Payer: Self-pay | Admitting: Obstetrics and Gynecology

## 2017-04-10 DIAGNOSIS — N760 Acute vaginitis: Secondary | ICD-10-CM

## 2017-04-10 DIAGNOSIS — B3731 Acute candidiasis of vulva and vagina: Secondary | ICD-10-CM | POA: Diagnosis present

## 2017-04-10 DIAGNOSIS — B373 Candidiasis of vulva and vagina: Secondary | ICD-10-CM

## 2017-04-10 DIAGNOSIS — B9689 Other specified bacterial agents as the cause of diseases classified elsewhere: Secondary | ICD-10-CM | POA: Diagnosis present

## 2017-04-10 LAB — URINALYSIS, ROUTINE W REFLEX MICROSCOPIC
Bilirubin Urine: NEGATIVE
Glucose, UA: NEGATIVE mg/dL
HGB URINE DIPSTICK: NEGATIVE
Ketones, ur: NEGATIVE mg/dL
Leukocytes, UA: NEGATIVE
NITRITE: NEGATIVE
PROTEIN: NEGATIVE mg/dL
Specific Gravity, Urine: 1.023 (ref 1.005–1.030)
pH: 7 (ref 5.0–8.0)

## 2017-04-10 LAB — GC/CHLAMYDIA PROBE AMP (~~LOC~~) NOT AT ARMC
Chlamydia: NEGATIVE
NEISSERIA GONORRHEA: NEGATIVE

## 2017-04-10 LAB — WET PREP, GENITAL
Sperm: NONE SEEN
Trich, Wet Prep: NONE SEEN

## 2017-04-10 MED ORDER — FLUCONAZOLE 150 MG PO TABS: 150.0000 mg | ORAL_TABLET | Freq: Every day | ORAL | 1 refills | Status: AC

## 2017-04-10 MED ORDER — METRONIDAZOLE 0.75 % VA GEL: 1.0000 | Freq: Every day | VAGINAL | 0 refills | Status: DC

## 2017-04-10 NOTE — Discharge Instructions (Signed)
In late 2019, the [facility] will be [facility]. At that time, the MAU (Maternity Admissions Unit), where you are being seen today, will no longer take care of non-pregnant patients. We strongly encourage you to find a doctor's office before that time, so that you can be seen with any GYN concerns, like vaginal discharge, urinary tract infection, etc.. in a timely manner. ° °In order to make an office visit more convenient, the [facility] will be offering evening hours with same-day appointments, walk-in appointments and scheduled appointments available during this time. ° °[facility] Hours: °Monday - 8am - 7:30 pm with walk-in between 4pm- 7:30 pm °Tuesday - 8 am - 5 pm (starting 06/12/17 we will be open late and accepting walk-ins from 4pm - 7:30pm) °Wednesday - 8 am - 5 pm (starting 09/12/17 we will be open late and accepting walk-ins from 4pm - 7:30pm) °Thursday 8 am - 5 pm (starting 12/13/17 we will be open late and accepting walk-ins from 4pm - 7:30pm) °Friday 8 am - 5 pm ° °For an appointment please call the [facility] at [phone] ° °For urgent needs, [facility] is also available for management of urgent GYN complaints such as vaginal discharge or urinary tract infections. ° ° ° ° ° °

## 2017-04-13 ENCOUNTER — Telehealth: Payer: Self-pay | Admitting: *Deleted

## 2017-04-13 DIAGNOSIS — N760 Acute vaginitis: Principal | ICD-10-CM

## 2017-04-13 DIAGNOSIS — B9689 Other specified bacterial agents as the cause of diseases classified elsewhere: Secondary | ICD-10-CM

## 2017-04-13 MED ORDER — METRONIDAZOLE 500 MG PO TABS: 500.0000 mg | ORAL_TABLET | Freq: Two times a day (BID) | ORAL | 0 refills | Status: AC

## 2017-04-13 NOTE — MAU Provider Note (Signed)
History     CSN: [id number]  Arrival date and time: 04/09/17 2239   None     Chief Complaint  Patient presents with  . Vaginal Discharge   HPI  Ms.  Alicia Morales is a 22 y.o. year old G0P0000 non-pregnant female who presents to MAU reporting vaginal odor x 2 days, vaginal pain. She last had intercourse on Saturday.   Past Medical History:  Diagnosis Date  . Seizures (HCC)    childhood  . Urinary tract infection     History reviewed. No pertinent surgical history.  History reviewed. No pertinent family history.  Social History   Tobacco Use  . Smoking status: Current Every Day Smoker    Packs/day: 0.20    Types: Cigars  . Smokeless tobacco: Never Used  Substance Use Topics  . Alcohol use: No  . Drug use: Yes    Types: Marijuana    Comment: last used 2 weeks ago    Allergies: No Known Allergies  No medications prior to admission.    Review of Systems  Constitutional: Negative.   HENT: Negative.   Eyes: Negative.   Respiratory: Negative.   Cardiovascular: Negative.   Gastrointestinal: Negative.   Endocrine: Negative.   Genitourinary: Positive for vaginal discharge (and odor).  Musculoskeletal: Negative.   Skin: Negative.   Allergic/Immunologic: Negative.   Neurological: Negative.   Hematological: Negative.   Psychiatric/Behavioral: Negative.    Physical Exam   Blood pressure 114/64, pulse 68, temperature 98.7 F (37.1 C), temperature source Oral, resp. rate 17, height 5\' 6"  (1.676 m), weight 200 lb (90.7 kg), SpO2 100 %.  Physical Exam  Constitutional: She is oriented to person, place, and time. She appears well-developed and well-nourished.  HENT:  Head: Normocephalic and atraumatic.  Neck: Normal range of motion.  Genitourinary:  Genitourinary Comments: Self-collect of Wet Prep & GC/CT swabs in BR  Neurological: She is alert and oriented to person, place, and time.  Skin: Skin is warm and dry.  Psychiatric: She has a normal mood and affect.  Her behavior is normal. Judgment and thought content normal.    MAU Course  Procedures  MDM [facility] UPT Wet Prep GC/CT -- pending  Recent Results (from the past 2160 hour(s))  GC/Chlamydia probe amp ([facility]not [facility]     Status: None   Collection Time: 04/09/17 12:00 AM  Result Value Ref Range   Chlamydia Negative     Comment: Normal Reference Range - Negative   Neisseria gonorrhea Negative     Comment: Normal Reference Range - Negative  Wet prep, genital     Status: Abnormal   Collection Time: 04/09/17 11:58 PM  Result Value Ref Range   Yeast Wet Prep HPF POC PRESENT (A) NONE SEEN   Trich, Wet Prep NONE SEEN NONE SEEN   Clue Cells Wet Prep HPF POC PRESENT (A) NONE SEEN   WBC, Wet Prep HPF POC FEW (A) NONE SEEN    Comment: FEW BACTERIA SEEN   Sperm NONE SEEN   Urinalysis, Routine w reflex microscopic     Status: Abnormal   Collection Time: 04/09/17 11:58 PM  Result Value Ref Range   Color, Urine YELLOW YELLOW   APPearance CLOUDY (A) CLEAR   Specific Gravity, Urine 1.023 1.005 - 1.030   pH 7.0 5.0 - 8.0   Glucose, UA NEGATIVE NEGATIVE mg/dL   Hgb urine dipstick NEGATIVE NEGATIVE   Bilirubin Urine NEGATIVE NEGATIVE   Ketones, ur NEGATIVE NEGATIVE mg/dL   Protein, ur  NEGATIVE NEGATIVE mg/dL   Nitrite NEGATIVE NEGATIVE   Leukocytes, UA NEGATIVE NEGATIVE    Assessment and Plan  Candida vaginitis - Rx for Diflucan 150 mg  - Information provided on vaginal yeast infection   Bacterial vaginitis - Rx for metrogel sent - Information provided on BV  Discharge home Changes in MAU GYN care info given  Patient verbalized an understanding of the plan of care and agrees.   Raelyn Mora, MSN, CNM 04/10/2017, 12:51 AM

## 2017-04-13 NOTE — Telephone Encounter (Signed)
Patient called requesting she receive flagyl in pill form rather than gel. Please return call.

## 2017-04-13 NOTE — Telephone Encounter (Signed)
Changed med to oral route, sent to pharmacy. Called patient and let her know.

## 2017-04-16 ENCOUNTER — Telehealth: Payer: Self-pay | Admitting: *Deleted

## 2017-04-16 NOTE — Telephone Encounter (Signed)
I called Alicia Morales and she states it is all squared away now and thanked me for calling.

## 2017-04-16 NOTE — Telephone Encounter (Signed)
Received a voice mail from 04/13/17 5:12 pm stating she got wrong medication sent to her pharmacy.

## 2019-01-29 IMAGING — US US PELVIS COMPLETE
1 series · 15 of 25 positions shown · non-contrast
Comparison: None

CLINICAL DATA: Low abdominal pain for 2 days.  LMP 2 weeks ago.

EXAM:
TRANSABDOMINAL AND TRANSVAGINAL ULTRASOUND OF PELVIS
TECHNIQUE: Both transabdominal and transvaginal ultrasound examinations of the
pelvis were performed. Transabdominal technique was performed for
global imaging of the pelvis including uterus, ovaries, adnexal
regions, and pelvic cul-de-sac. It was necessary to proceed with
endovaginal exam following the transabdominal exam to visualize the
ovaries and endometrium.

[Series 1: us pelvis complete · 15 of 65 slices shown]
[im 1/65]
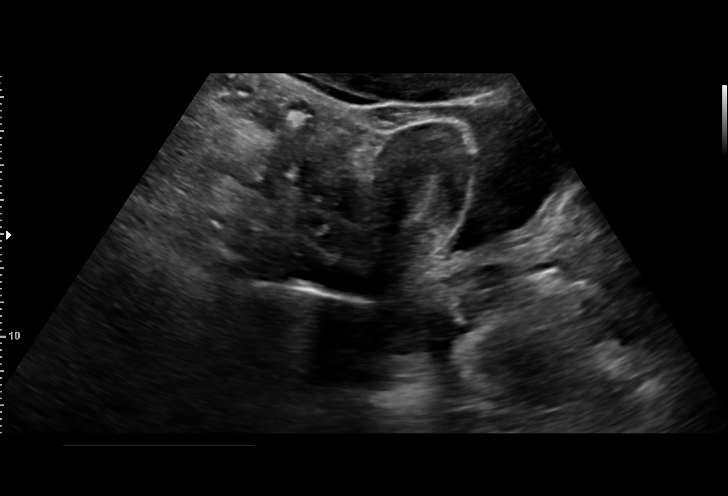
[im 6/65]
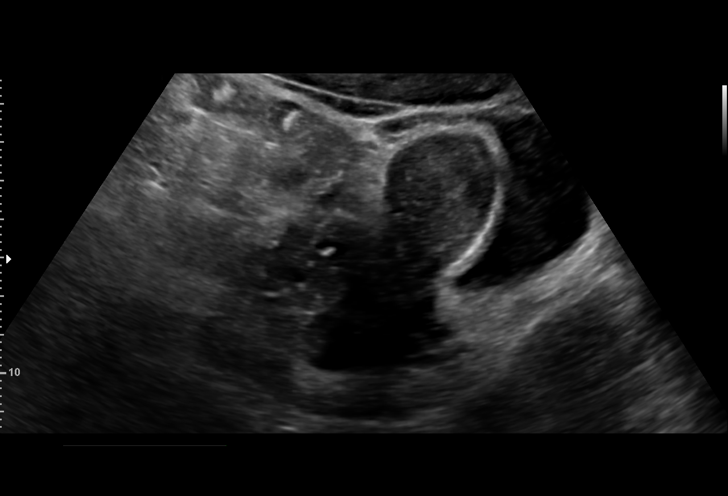
[im 11/65]
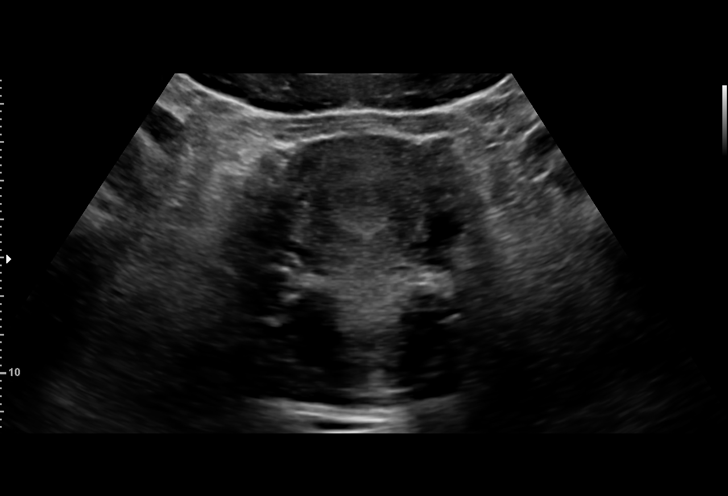
[im 14/65]
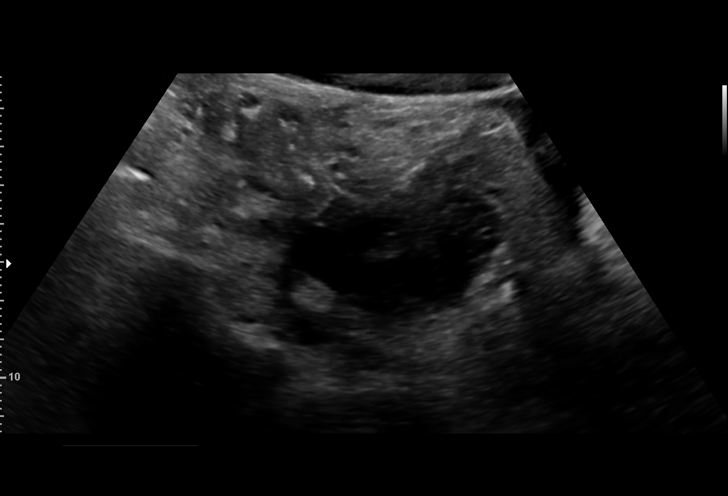
[im 19/65]
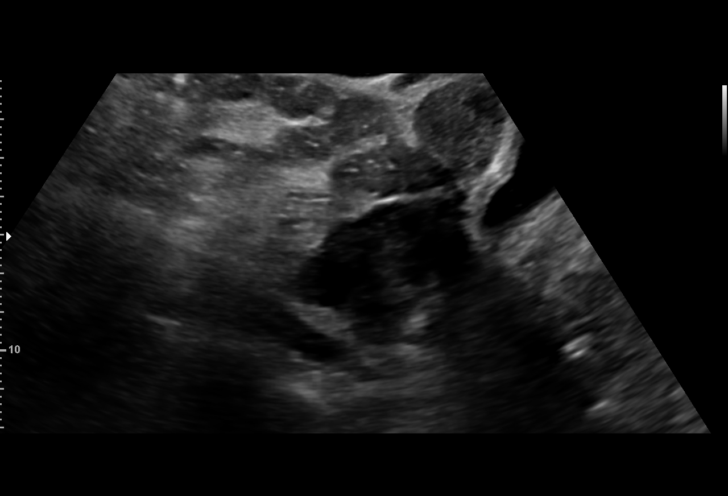
[im 25/65]
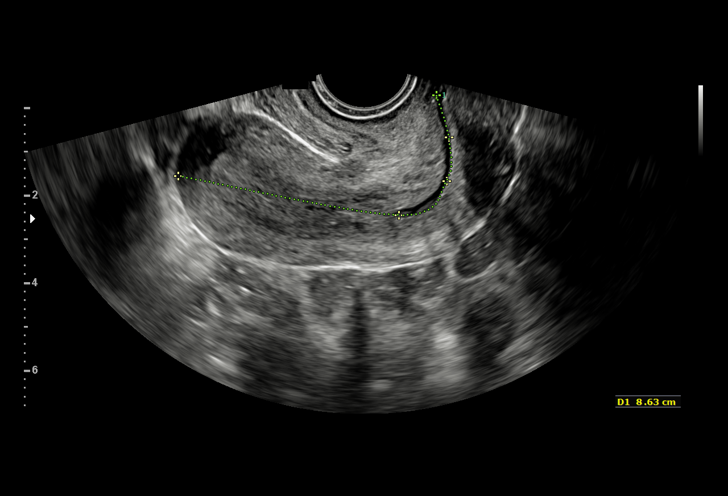
[im 27/65]
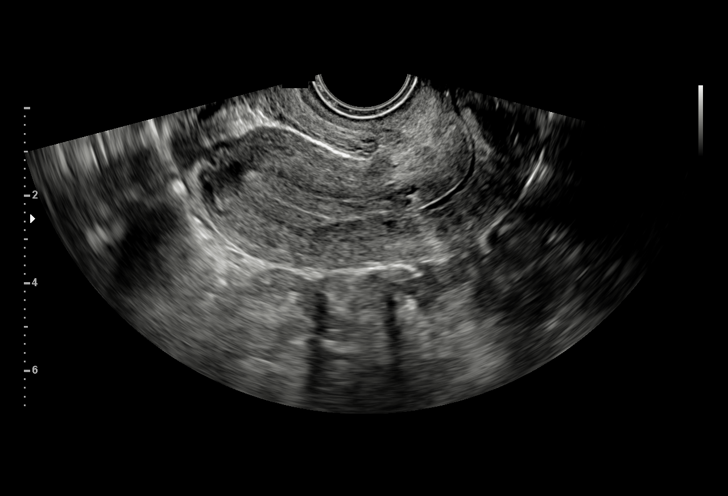
[im 33/65]
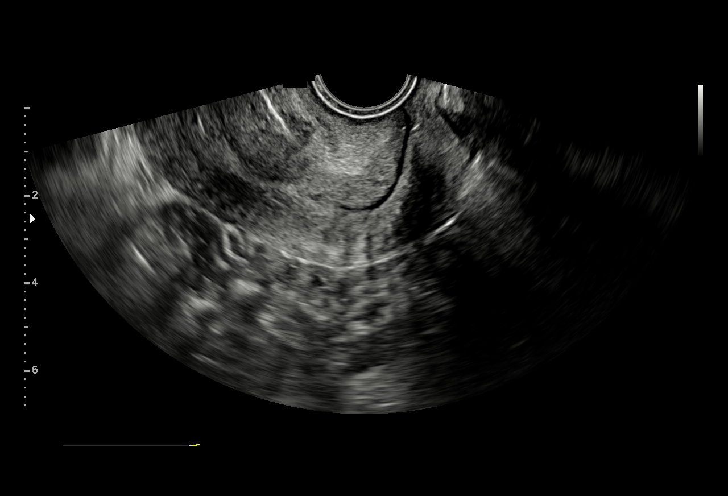
[im 38/65]
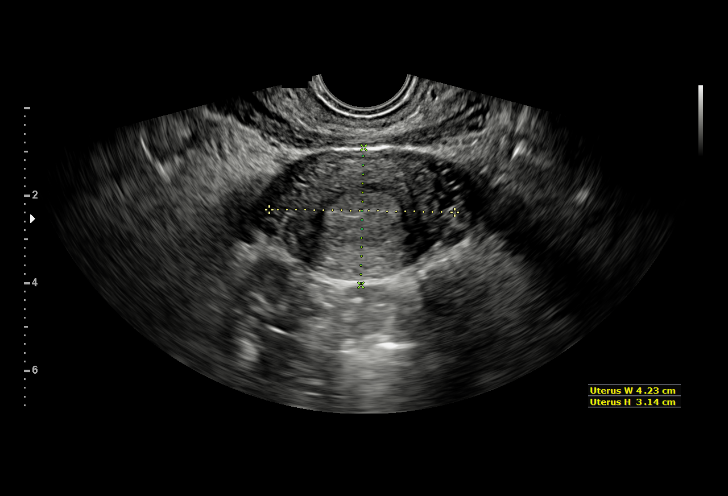
[im 41/65]
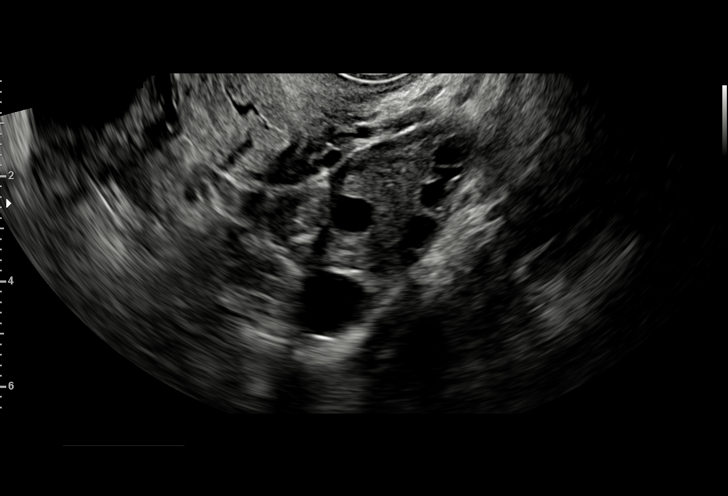
[im 46/65]
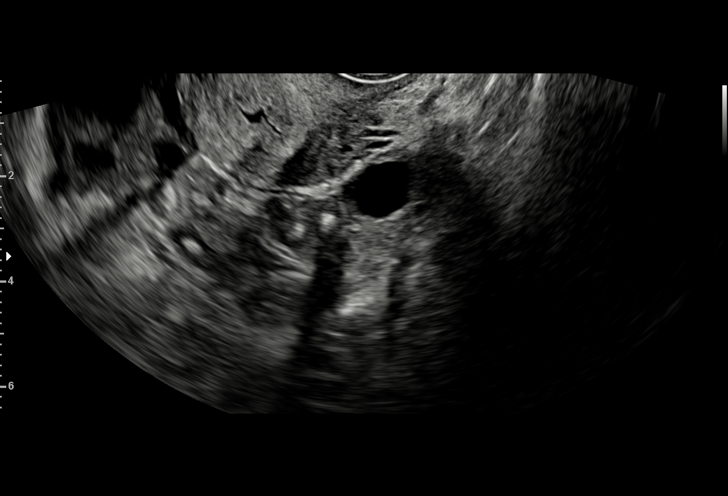
[im 51/65]
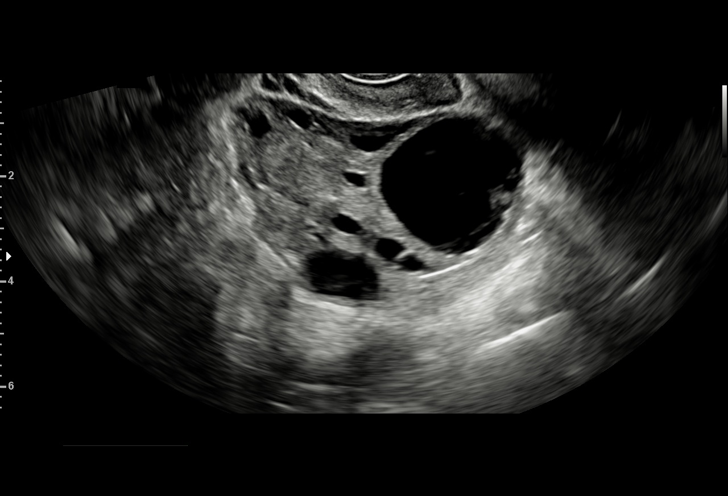
[im 54/65]
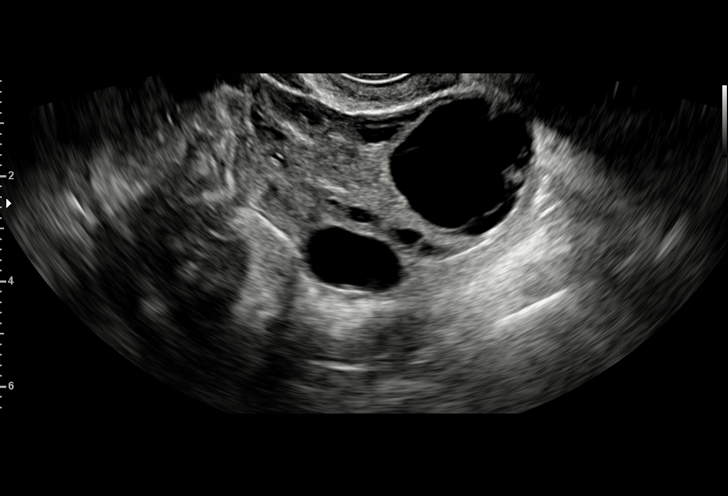
[im 59/65]
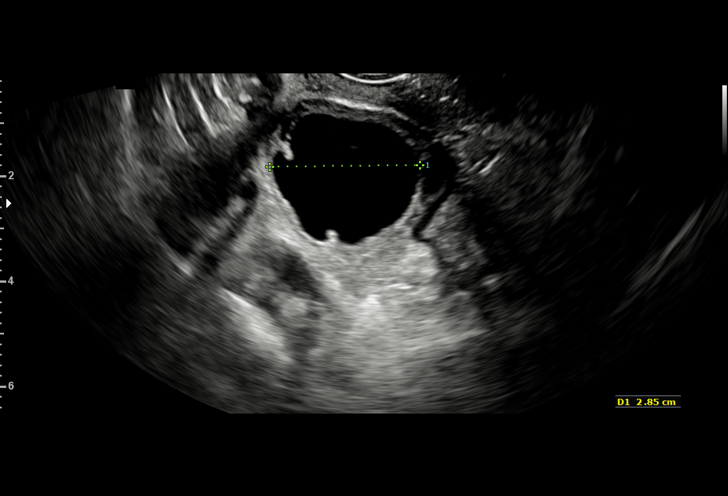
[im 65/65]
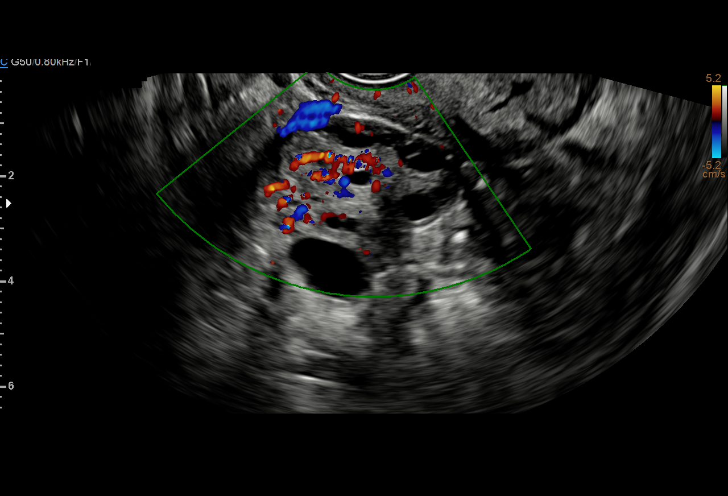

[15 of 25 positions shown; findings below may reference images not displayed]

FINDINGS: Uterus

Measurements: 8.6 x 3.1 x 4.2 cm. Uterus is anteverted. No fibroids
or other mass visualized.

Endometrium

Thickness: 8.8 mm.  Small amount of fluid in the endocervical canal.

Right ovary

Measurements: 5.4 x 3.5 x 3.7 cm. Normal follicular changes.
Dominant complex cyst, likely hemorrhagic and measuring 2.9 cm
maximal diameter. No abnormal adnexal mass.

Left ovary

Measurements: 4.8 x 2.3 x 2.2 cm. Normal appearance/no adnexal mass.

Other findings

No abnormal free fluid. Flow is demonstrated in both ovaries on
color flow Doppler imaging.
IMPRESSION: Small amount of fluid in the endocervical canal is likely
physiologic. Otherwise normal appearance of the uterus and ovaries.
